# Patient Record
Sex: Male | Born: 1985 | Hispanic: Yes | Marital: Married | Smoking: Never smoker
Health system: Southern US, Community
[De-identification: ages and names within clinical notes are randomized; demographics above are authoritative.]

## PROBLEM LIST (undated history)

## (undated) DIAGNOSIS — N189 Chronic kidney disease, unspecified: Secondary | ICD-10-CM

## (undated) HISTORY — DX: Chronic kidney disease, unspecified: N18.9

---

## 2015-02-14 ENCOUNTER — Ambulatory Visit (INDEPENDENT_AMBULATORY_CARE_PROVIDER_SITE_OTHER): Payer: Self-pay | Admitting: Family Medicine

## 2015-02-14 VITALS — BP 120/78 | HR 73 | Temp 99.1°F | Resp 20 | Ht 73.0 in | Wt 275.5 lb

## 2015-02-14 DIAGNOSIS — R0789 Other chest pain: Secondary | ICD-10-CM

## 2015-02-14 DIAGNOSIS — R1013 Epigastric pain: Secondary | ICD-10-CM

## 2015-02-14 DIAGNOSIS — M949 Disorder of cartilage, unspecified: Secondary | ICD-10-CM

## 2015-02-14 MED ORDER — OMEPRAZOLE 20 MG PO CPDR
20.0000 mg | DELAYED_RELEASE_CAPSULE | Freq: Every day | ORAL | Status: DC
Start: 2015-02-14 — End: 2016-07-13

## 2015-02-14 MED ORDER — MELOXICAM 7.5 MG PO TABS: 7.5000 mg | ORAL_TABLET | Freq: Every day | ORAL | Status: DC

## 2015-02-14 NOTE — Patient Instructions (Signed)
Reflujo gastroesofgico - Adultos  (Gastroesophageal Reflux Disease, Adult)  El reflujo gastroesofgico ocurre cuando el cido del estmago pasa al esfago. Cuando el cido entra en contacto con el esfago, el cido provoca dolor (inflamacin) en el esfago. Con el tiempo, pueden formarse pequeos agujeros (lceras) en el revestimiento del esfago. CAUSAS   Exceso de Engineer, site. Esto aplica presin [employer], lo que hace que el cido del estmago suba hacia el esfago.  El hbito de fumar [place]a la produccin de cido en el [place].  El consumo de alcohol. Provoca disminucin de la presin en el esfnter esofgico inferior (vlvula o anillo de msculo entre el esfago y Product manager), permitiendo que el cido del estmago suba hacia el esfago.  Cenas a ltima hora del da y estmago lleno. Aumenta la presin y la produccin de cido en el estmago.  Malformacin en el esfnter esofgico inferior. A menudo no se halla causa.  SNTOMAS   Ardor y Aeronautical engineer parte inferior del pecho detrs del esternn y en la zona media del [place]. Puede ocurrir [employer] por semana o ms a menudo.  Dificultad para tragar.  Dolor de Investment banker, operational.  Tos seca.  Sntomas similares al asma que incluyen sensacin de opresin en el pecho, falta de aire y sibilancias. DIAGNSTICO  El mdico diagnosticar el problema basndose en los sntomas. En algunos casos, se indican radiografas y otras pruebas para verificar si hay complicaciones o para comprobar el estado del estmago y Education administrator.  TRATAMIENTO  El mdico le indicar medicamentos de venta libre o recetados para ayudar a disminuir la produccin de cido. Consulte con su mdico antes de Art gallery manager o agregar cualquier medicamento nuevo.  INSTRUCCIONES PARA EL CUIDADO EN EL HOGAR   Modifique los factores que pueda cambiar. Consulte con su mdico para solicitar orientacin relacionada con la prdida de peso, dejar de fumar y el consumo de  alcohol.  Evite las comidas y bebidas que empeoran los [place], [place]:  [place] con cafena o alcohlicas.  Chocolate.  Sabores a English as a second language teacher.  Ajo y cebolla.  Comidas muy condimentadas.  Ctricos como naranjas, limones o limas.  Alimentos que contengan tomate, como salsas, [place] y pizza.  Alimentos fritos y Radio broadcast assistant.  Evite acostarse durante 3 horas antes de irse a dormir o antes de tomar una siesta.  Haga comidas pequeas durante Psychiatrist de 3 comidas abundantes.  Use ropas sueltas. No use nada apretado alrededor de la cintura que cause presin en el estmago.  Levante (eleve) la cabecera de la cama 6 a 8 pulgadas (15 a 20 cm) con bloques de madera. Usar almohadas extra no ayuda.  Solo tome medicamentos que se pueden comprar sin receta o recetados para el dolor, Tree surgeon o fiebre, como le indica el mdico.  No tome aspirina, ibuprofeno ni antiinflamatorios no esteroides. [place] [employer] SI:   [employer], el cuello, la [place], los dientes o la espalda.  El dolor aumenta o cambia la intensidad o la durancin.  Tiene nuseas, vmitos o sudoracin(diaforesis).  Siente falta de aire o dolor en el pecho, o se desmaya.  Vomita y el vmito tiene [place], es de color [place], [place], negro o es similar a la borra del caf o tiene [place].  Las heces son rojas, sanguinolentas o negras. Estos sntomas pueden ser signos de otros problemas, como enfermedades cardacas, hemorragias gstrias o sangrado esofgico.  ASEGRESE DE QUE:   Comprende estas instrucciones.  Controlar su enfermedad.  Solicitar ayuda de inmediato si no mejora o si empeora. Document Released: 07/25/2005 Document Revised: 01/07/2012 [facility] Patient Information 2015 [place]Exit[place], [place]. This information is not intended to replace advice given to you by your health care provider. Make sure you discuss any questions you have with your health care provider. Dolor de la  pared torcica (Chest Wall Pain) Dolor en la pared torcica es dolor en o alrededor de los huesos y msculos de su pecho. Podrn pasar hasta 6 semanas hasta que comience a mejorar. Puede demorar ms tiempo si es fsicamente activo en su Aleen Campitrabajo y Gettysburgactividades.  CAUSAS  El dolor en el pecho puede aparecer sin motivo. No obstante, algunas causas pueden ser:   Neomia DearUna enfermedad viral como la gripe.  Traumatismos.  Tos.  La prctica de ejercicios.  Artritis.  Fibromialgia  [place]. INSTRUCCIONES PARA EL CUIDADO DOMICILIARIO  Evite hacer actividad fsica extenuante. Trate de no esforzarse o Electrical engineerrealizar actividades que le causen dolor. Aqu se incluyen las actividades en las que Bots[place]ausa los msculos del trax, los abdominales y los msculos laterales, especialmente si debe levantar objetos pesados.  Aplique hielo sobre la zona dolorida.  Ponga el hielo en una bolsa plstica.  Colquese una toalla entre la piel y la bolsa de hielo.  Deje la bolsa de hielo durante 15 a 20 minutos por hora, durante los primeros [address]  [address].  Utilice los medicamentos de venta libre o de prescripcin para Chief Technology Officerel dolor, Environmental health practitionerel malestar o la Spring Hillfiebre, segn se lo indique el profesional que lo asiste. SOLICITE ATENCIN MDICA DE INMEDIATO SI:  El dolor aumenta o siente muchas molestias.  Tiene fiebre.  El dolor de Oaklandpecho empeora.  Desarrolla nuevos e inexplicables sntomas.  Tiene nuseas o vmitos.  Berenice Primasranspira o se siente mareado.  Tiene tos con flema (esputo), o tose con sangre. EST SEGURO QUE:   Comprende las instrucciones para el alta mdica.  Controlar su enfermedad.  Solicitar atencin mdica de inmediato segn las indicaciones. Document Released: 11/26/2006 Document Revised: 01/07/2012 [facility] Patient Information 2015 [place]ExitCare, [place]LLC. This information is not intended to replace advice given to you by your health care provider. Make sure you discuss any questions you have with your health care  provider. Gastroesophageal Reflux Disease, Adult Gastroesophageal reflux disease (GERD) happens when acid from your stomach flows up into the esophagus. When acid comes in contact with the esophagus, the acid causes soreness (inflammation) in the esophagus. Over time, GERD may create small holes (ulcers) in the lining of the esophagus. CAUSES   Increased body weight. This puts pressure on the stomach, making acid rise from the stomach into the esophagus.  Smoking. This increases acid production in the stomach.  Drinking alcohol. This causes decreased pressure in the lower esophageal sphincter (valve or ring of muscle between the esophagus and stomach), allowing acid from the stomach into the esophagus.  Late evening meals and a full stomach. This increases pressure and acid production in the stomach.  A malformed lower esophageal sphincter. Sometimes, no cause is found. SYMPTOMS   Burning pain in the lower part of the mid-chest behind the breastbone and in the mid-stomach area. This may occur twice a week or more often.  Trouble swallowing.  Sore throat.  Dry cough.  Asthma-like symptoms including chest tightness, shortness of breath, or wheezing. DIAGNOSIS  Your caregiver may be able to diagnose GERD based on your symptoms. In some cases, X-rays and other tests may be done to check for complications or to check the condition of your stomach  and esophagus. TREATMENT  Your caregiver may recommend over-the-counter or prescription medicines to help decrease acid production. Ask your caregiver before starting or adding any new medicines.  HOME CARE INSTRUCTIONS   Change the factors that you can control. Ask your caregiver for guidance concerning weight loss, quitting smoking, and alcohol consumption.  Avoid foods and drinks that make your symptoms worse, such as:  Caffeine or alcoholic drinks.  Chocolate.  Peppermint or mint flavorings.  Garlic and onions.  Spicy  foods.  Citrus fruits, such as oranges, lemons, or limes.  Tomato-based foods such as sauce, chili, salsa, and pizza.  Fried and fatty foods.  Avoid lying down for the 3 hours prior to your bedtime or prior to taking a nap.  Eat small, frequent meals instead of large meals.  Wear loose-fitting clothing. Do not wear anything tight around your waist that causes pressure on your stomach.  Raise the head of your bed 6 to 8 inches with wood blocks to help you sleep. Extra pillows will not help.  Only take over-the-counter or prescription medicines for pain, discomfort, or fever as directed by your caregiver.  Do not take aspirin, ibuprofen, or other nonsteroidal anti-inflammatory drugs (NSAIDs). SEEK IMMEDIATE MEDICAL CARE IF:   You have pain in your arms, neck, jaw, teeth, or back.  Your pain increases or changes in intensity or duration.  You develop nausea, vomiting, or sweating (diaphoresis).  You develop shortness of breath, or you faint.  Your vomit is green, yellow, black, or looks like coffee grounds or blood.  Your stool is red, bloody, or black. These symptoms could be signs of other problems, such as heart disease, gastric bleeding, or esophageal bleeding. MAKE SURE YOU:   Understand these instructions.  Will watch your condition.  Will get help right away if you are not doing well or get worse. Document Released: 07/25/2005 Document Revised: 01/07/2012 Document Reviewed: 05/04/2011 [facility] Patient Information 2015 [place], [place]. This information is not intended to replace advice given to you by your health care provider. Make sure you discuss any questions you have with your health care provider.

## 2015-02-14 NOTE — Progress Notes (Signed)
° °  Subjective:  This chart was scribed for Elvina SidleKurt Lauenstein MD,  by Veverly FellsHatice Demirci,scribe, at [facility].  This patient was seen in room 8 and the patient's care was started at 7:27 PM.    Chief Complaint  Patient presents with   Abdominal Pain     Patient ID: Paul Bates, male    DOB: [birth year], 29 y.o.   MRN: [id number]  HPI  HPI Comments: Paul Bates is a 29 y.o. male who [facility] for epigastric pain which is worsening when lifting his arms onset a couple days ago.  Patient is a Education administratorpainter.     Patient primarily speaks spanish.      Review of Systems  Constitutional: Negative for fever and chills.  HENT: Negative for ear discharge, ear pain, facial swelling, hearing loss, nosebleeds, postnasal drip and rhinorrhea.   Respiratory: Negative for cough, choking, chest tightness and shortness of breath.   Gastrointestinal: Positive for abdominal pain.       Objective:   Physical Exam BP 120/78 mmHg   Pulse 73   Temp(Src) 99.1 F (37.3 C) (Oral)   Resp 20   Ht 6\' 1"  (1.854 m)   Wt 275 lb 8 oz (124.966 kg)   BMI 36.36 kg/m2   SpO2 97%  Obese young adult male in no acute distress Tenderness of xyphoid as well as in his epigastrium with deep palpation. No HSM or masses Chest is clear Heart: Regular no murmur Abdomen: No rash      Assessment & Plan:   This chart was scribed in my presence and reviewed by me personally.    ICD-9-CM ICD-10-CM   1. Xiphoid pain 733.90 M94.9 meloxicam (MOBIC) 7.5 MG tablet  2. Abdominal pain, epigastric 789.06 R10.13 omeprazole (PRILOSEC) 20 MG capsule   No acute findings. I think patient is more concerned the discomfort in his xiphoid which appears to be a musculoskeletal strain.  Signed, Elvina SidleKurt Lauenstein, MD

## 2016-07-13 ENCOUNTER — Emergency Department (HOSPITAL_COMMUNITY)
Admission: EM | Admit: 2016-07-13 | Discharge: 2016-07-13 | Disposition: A | Discharge status: Home / Self Care | Payer: Self-pay | Attending: Emergency Medicine | Admitting: Emergency Medicine

## 2016-07-13 ENCOUNTER — Encounter (HOSPITAL_COMMUNITY): Payer: Self-pay | Admitting: *Deleted

## 2016-07-13 ENCOUNTER — Emergency Department (HOSPITAL_COMMUNITY): Payer: Self-pay

## 2016-07-13 DIAGNOSIS — N201 Calculus of ureter: Secondary | ICD-10-CM

## 2016-07-13 DIAGNOSIS — N132 Hydronephrosis with renal and ureteral calculous obstruction: Secondary | ICD-10-CM | POA: Insufficient documentation

## 2016-07-13 LAB — CBC WITH DIFFERENTIAL/PLATELET
BASOS PCT: 0 %
Basophils Absolute: 0 10*3/uL (ref 0.0–0.1)
EOS ABS: 0 10*3/uL (ref 0.0–0.7)
Eosinophils Relative: 0 %
HCT: 42.7 % (ref 39.0–52.0)
Hemoglobin: 14.2 g/dL (ref 13.0–17.0)
LYMPHS ABS: 0.9 10*3/uL (ref 0.7–4.0)
Lymphocytes Relative: 8 %
MCH: 27.4 pg (ref 26.0–34.0)
MCHC: 33.3 g/dL (ref 30.0–36.0)
MCV: 82.3 fL (ref 78.0–100.0)
MONO ABS: 0.5 10*3/uL (ref 0.1–1.0)
MONOS PCT: 5 %
NEUTROS PCT: 87 %
Neutro Abs: 10.2 10*3/uL — ABNORMAL HIGH (ref 1.7–7.7)
PLATELETS: 224 10*3/uL (ref 150–400)
RBC: 5.19 MIL/uL (ref 4.22–5.81)
RDW: 12.7 % (ref 11.5–15.5)
WBC: 11.7 10*3/uL — ABNORMAL HIGH (ref 4.0–10.5)

## 2016-07-13 LAB — BASIC METABOLIC PANEL
Anion gap: 9 (ref 5–15)
BUN: 17 mg/dL (ref 6–20)
CALCIUM: 9.4 mg/dL (ref 8.9–10.3)
CO2: 20 mmol/L — AB (ref 22–32)
CREATININE: 1.18 mg/dL (ref 0.61–1.24)
Chloride: 106 mmol/L (ref 101–111)
GFR calc non Af Amer: >60 mL/min (ref >60)
GLUCOSE: 159 mg/dL — AB (ref 65–99)
Potassium: 4.2 mmol/L (ref 3.5–5.1)
Sodium: 135 mmol/L (ref 135–145)

## 2016-07-13 LAB — URINALYSIS, ROUTINE W REFLEX MICROSCOPIC
BILIRUBIN URINE: NEGATIVE
Glucose, UA: 100 mg/dL — AB
KETONES UR: NEGATIVE mg/dL
Leukocytes, UA: NEGATIVE
NITRITE: NEGATIVE
Protein, ur: NEGATIVE mg/dL
Specific Gravity, Urine: 1.025 (ref 1.005–1.030)
pH: 5 (ref 5.0–8.0)

## 2016-07-13 LAB — URINE MICROSCOPIC-ADD ON

## 2016-07-13 MED ORDER — TAMSULOSIN HCL 0.4 MG PO CAPS: 0.4000 mg | ORAL_CAPSULE | Freq: Every day | ORAL | 0 refills | Status: DC

## 2016-07-13 MED ORDER — KETOROLAC TROMETHAMINE 30 MG/ML IJ SOLN
30.0000 mg | Freq: Once | INTRAMUSCULAR | Status: AC
  Administered 2016-07-13: 30 mg via INTRAVENOUS
  Filled 2016-07-13: qty 1

## 2016-07-13 MED ORDER — MORPHINE SULFATE (PF) 4 MG/ML IV SOLN
4.0000 mg | Freq: Once | INTRAVENOUS | Status: AC
  Administered 2016-07-13: 4 mg via INTRAVENOUS
  Filled 2016-07-13: qty 1

## 2016-07-13 MED ORDER — SODIUM CHLORIDE 0.9 % IV SOLN
1000.0000 mL | INTRAVENOUS | Status: DC
  Administered 2016-07-13: 1000 mL via INTRAVENOUS

## 2016-07-13 MED ORDER — SODIUM CHLORIDE 0.9 % IV SOLN
1000.0000 mL | Freq: Once | INTRAVENOUS | Status: AC
  Administered 2016-07-13: 1000 mL via INTRAVENOUS

## 2016-07-13 MED ORDER — OXYCODONE-ACETAMINOPHEN 5-325 MG PO TABS: 1.0000 | ORAL_TABLET | ORAL | 0 refills | Status: DC | PRN

## 2016-07-13 MED ORDER — ONDANSETRON HCL 4 MG/2ML IJ SOLN
4.0000 mg | Freq: Once | INTRAMUSCULAR | Status: AC
  Administered 2016-07-13: 4 mg via INTRAVENOUS
  Filled 2016-07-13: qty 2

## 2016-07-13 NOTE — ED Provider Notes (Signed)
[facility] Provider Note   CSN: [id number] Arrival date & time: 07/13/16  4098     History   Chief Complaint Chief Complaint  Patient presents with  . Constipation  . Back Pain    HPI Paul Bates is a 30 y.o. male.  The history is provided by the patient.  He had onset about 3 hours ago of severe pain in the left flank radiating around to the suprapubic area. Nothing makes pain better nothing makes it worse. He rates pain at 9/10. There is no associated nausea or vomiting. He has a sense of having to defecate but is unable to. He has not done anything to treat the pain. He denies similar pains in the past.  History reviewed. No pertinent past medical history.  There are no active problems to display for this patient.   History reviewed. No pertinent surgical history.     Home Medications    Prior to Admission medications   Medication Sig Start Date End Date Taking? Authorizing Provider  meloxicam (MOBIC) 7.5 MG tablet Take 1 tablet (7.5 mg total) by mouth daily. 02/14/15   Elvina Sidle, MD  omeprazole (PRILOSEC) 20 MG capsule Take 1 capsule (20 mg total) by mouth daily. 02/14/15   Elvina Sidle, MD    Family History No family history on file.  Social History Social History  Substance Use Topics  . Smoking status: Never Smoker  . Smokeless tobacco: Never Used  . Alcohol use No     Allergies   Review of patient's allergies indicates no known allergies.   Review of Systems Review of Systems  All other systems reviewed and are negative.    Physical Exam Updated Vital Signs BP 142/98 (BP Location: Right Arm)   Pulse 95   Temp 98.4 F (36.9 C) (Oral)   Resp 22   Wt 273 lb 6.4 oz (124 kg)   SpO2 97%   BMI 36.07 kg/m   Physical Exam  Nursing note and vitals reviewed.  30 year old male, appears uncomfortable, but is in no acute distress. Vital signs are significant for borderline tachypnea and mild hypertension. Oxygen  saturation is 97%, which is normal. Head is normocephalic and atraumatic. PERRLA, EOMI. Oropharynx is clear. Neck is nontender and supple without adenopathy or JVD. Back is nontender in the midline. There is moderate left CVA tenderness. Lungs are clear without rales, wheezes, or rhonchi. Chest is nontender. Heart has regular rate and rhythm without murmur. Abdomen is soft, flat, nontender without masses or hepatosplenomegaly and peristalsis is hypoactive. Extremities have no cyanosis or edema, full range of motion is present. Skin is warm and dry without rash. Neurologic: Mental status is normal, cranial nerves are intact, there are no motor or sensory deficits.  ED Treatments / Results  Labs (all labs ordered are listed, but only abnormal results are displayed) Labs Reviewed  BASIC METABOLIC PANEL - Abnormal; Notable for the following:       Result Value   CO2 20 (*)    Glucose, Bld 159 (*)    All other components within normal limits  CBC WITH DIFFERENTIAL/PLATELET - Abnormal; Notable for the following:    WBC 11.7 (*)    Neutro Abs 10.2 (*)    All other components within normal limits  URINALYSIS, ROUTINE W REFLEX MICROSCOPIC (NOT AT [facility])    Radiology Ct Renal Stone Study  Result Date: 07/13/2016 CLINICAL DATA:  Left flank pain EXAM: CT ABDOMEN AND PELVIS WITHOUT CONTRAST TECHNIQUE: Multidetector  CT imaging of the abdomen and pelvis was performed following the standard protocol without IV contrast. COMPARISON:  None. FINDINGS: Lower chest: Minor atelectatic appearing linear opacities in the posterior lung bases. Hepatobiliary: Fatty liver without suspicious focal lesion. Gallbladder and bile ducts are unremarkable. Pancreas: Normal Spleen: Norm Adrenals/Urinary Tract: There is an obstructing 3 mm calculus at the left ureterovesical junction with moderate ureteral dilatation and hydronephrosis. There are at least 2 additional lower pole left collecting system calculi measuring 2-3  mm. No suspicious renal parenchymal lesions. Both adrenals are normal. The urinary bladder is unremarkable. Stomach/Bowel: There are normal appearances of the stomach, small bowel and colon. The appendix is normal. Vascular/Lymphatic: The abdominal aorta is normal in caliber. There is no atherosclerotic calcification. There is no adenopathy in the abdomen or pelvis. Reproductive: Unremarkable Other: Musculoskeletal: No significant skeletal abnormality. IMPRESSION: Obstructing 3 mm left UVJ calculus with moderate hydronephrosis. Left nephrolithiasis. Fatty liver. Electronically Signed   By: Ellery Plunkaniel R Mitchell M.D.   On: 07/13/2016 05:39    Procedures Procedures (including critical care time)  Medications Ordered in ED Medications  0.9 %  sodium chloride infusion (0 mLs Intravenous Stopped 07/13/16 0609)    Followed by  0.9 %  sodium chloride infusion (0 mLs Intravenous Stopped 07/13/16 0622)  ketorolac (TORADOL) 30 MG/ML injection 30 mg (30 mg Intravenous Given 07/13/16 0455)  ondansetron (ZOFRAN) injection 4 mg (4 mg Intravenous Given 07/13/16 0455)  morphine 4 MG/ML injection 4 mg (4 mg Intravenous Given 07/13/16 0455)     Initial Impression / Assessment and Plan / ED Course  I have reviewed the triage vital signs and the nursing notes.  Pertinent labs & imaging results that were available during my care of the patient were reviewed by me and considered in my medical decision making (see chart for details).  Clinical Course   Left flank pain worrisome for ureterolithiasis. He'll be given morphine and ketorolac and will be sent for renal stone protocol CT scan. Old records are reviewed, and he has no relevant past visits.  CT shows distal left ureteral calculus. This is likely to pass without any interventions. He got good relief of pain with above noted treatment. He is discharged with prescriptions for tamsulosin and oxycodone have acetaminophen, told to use over-the-counter naproxen or  ibuprofen.  Final Clinical Impressions(s) / ED Diagnoses   Final diagnoses:  Ureterolithiasis    New Prescriptions New Prescriptions   OXYCODONE-ACETAMINOPHEN (PERCOCET) 5-325 MG TABLET    Take 1 tablet by mouth every 4 (four) hours as needed for moderate pain.   TAMSULOSIN (FLOMAX) 0.4 MG CAPS CAPSULE    Take 1 capsule (0.4 mg total) by mouth daily.     Dione Boozeavid Devika Dragovich, MD 07/13/16 [phone]

## 2016-07-13 NOTE — ED Notes (Signed)
Pt aware of need for urine  

## 2016-07-13 NOTE — ED Triage Notes (Signed)
Patient states he is having a hard time having a BM (3 hours) and c/o back pain

## 2016-07-13 NOTE — ED Triage Notes (Signed)
Patient stating the pain travels around the flank area to the left front and having difficulty making water

## 2016-07-13 NOTE — ED Notes (Signed)
Patient transported to CT 

## 2016-07-13 NOTE — Discharge Instructions (Signed)
Take ibuprofen or naproxen. Return if pain is not being controlled, or if you start running a fever.

## 2016-11-08 ENCOUNTER — Ambulatory Visit (INDEPENDENT_AMBULATORY_CARE_PROVIDER_SITE_OTHER): Payer: Self-pay

## 2016-11-08 ENCOUNTER — Ambulatory Visit (INDEPENDENT_AMBULATORY_CARE_PROVIDER_SITE_OTHER): Payer: Self-pay | Admitting: Urgent Care

## 2016-11-08 VITALS — BP 98/70 | HR 81 | Temp 98.5°F | Resp 17 | Ht 73.0 in | Wt 297.0 lb

## 2016-11-08 DIAGNOSIS — R109 Unspecified abdominal pain: Secondary | ICD-10-CM

## 2016-11-08 DIAGNOSIS — N200 Calculus of kidney: Secondary | ICD-10-CM

## 2016-11-08 DIAGNOSIS — R319 Hematuria, unspecified: Secondary | ICD-10-CM

## 2016-11-08 LAB — POCT URINALYSIS DIP (MANUAL ENTRY)
BILIRUBIN UA: NEGATIVE
BILIRUBIN UA: NEGATIVE
Glucose, UA: NEGATIVE
LEUKOCYTES UA: NEGATIVE
NITRITE UA: NEGATIVE
PH UA: 7
PROTEIN UA: NEGATIVE
RBC UA: NEGATIVE
Spec Grav, UA: 1.02
Urobilinogen, UA: 0.2

## 2016-11-08 LAB — POCT CBC
Granulocyte percent: 79.3 %G (ref 37–80)
HEMATOCRIT: 44.3 % (ref 43.5–53.7)
Hemoglobin: 15.4 g/dL (ref 14.1–18.1)
LYMPH, POC: 1.6 (ref 0.6–3.4)
MCH, POC: 27.7 pg (ref 27–31.2)
MCHC: 34.7 g/dL (ref 31.8–35.4)
MCV: 79.9 fL — AB (ref 80–97)
MID (CBC): 0.6 (ref 0–0.9)
MPV: 8.1 fL (ref 0–99.8)
PLATELET COUNT, POC: 204 10*3/uL (ref 142–424)
POC Granulocyte: 8.6 — AB (ref 2–6.9)
POC LYMPH %: 14.7 % (ref 10–50)
POC MID %: 6 %M (ref 0–12)
RBC: 5.54 M/uL (ref 4.69–6.13)
RDW, POC: 12.9 %
WBC: 10.8 10*3/uL — AB (ref 4.6–10.2)

## 2016-11-08 MED ORDER — OXYCODONE-ACETAMINOPHEN 5-325 MG PO TABS: 1.0000 | ORAL_TABLET | Freq: Three times a day (TID) | ORAL | 0 refills | Status: DC | PRN

## 2016-11-08 MED ORDER — TAMSULOSIN HCL 0.4 MG PO CAPS: 0.4000 mg | ORAL_CAPSULE | Freq: Every day | ORAL | 1 refills | Status: DC

## 2016-11-08 NOTE — Patient Instructions (Addendum)
Clico renal (Renal Colic) El clico renal es un dolor causado por el paso de un clculo en el rin. El dolor puede ser agudo e intenso. Puede sentirse en la espalda, el abdomen, al costado (fosa lumbar) o la ingle. Puede causar nuseas. El clico renal puede aparecer y Geneticist, moleculardesaparecer. INSTRUCCIONES PARA EL CUIDADO EN EL HOGAR Controle su afeccin para ver si hay cambios. Las siguientes medidas pueden servir para Paramedicaliviar cualquier molestia que est sintiendo:  Tome los medicamentos solamente como se lo haya indicado el mdico.  Pregntele al mdico si puede tomar analgsicos de Whitefaceventa libre.  Beba suficiente lquido para Photographermantener la orina clara o de color amarillo plido. Albesa SeenBeba entre 6 y 8vasos de agua por Futures traderda.  Limite la cantidad de sal que consume a menos de 2gramos por da.  Reduzca la cantidad de protenas de la dieta. Consuma menos carne, pescado, frutos secos y productos lcteos.  Evite alimentos como la espinaca, el ruibarbo, los frutos secos o el salvado, ya que pueden aumentar la probabilidad de que se formen clculos. SOLICITE ATENCIN MDICA SI:  Tiene fiebre o siente escalofros.  La orina se torna de color turbio o tiene American Standard Companiesolor fuerte.  Siente dolor o ardor al Geographical information systems officerorinar. SOLICITE ATENCIN MDICA DE INMEDIATO SI:  El dolor en la fosa lumbar o la ingle se intensifica repentinamente.  Est confundido o desorientado, o pierde la conciencia. Esta informacin no tiene Theme park managercomo fin reemplazar el consejo del mdico. Asegrese de hacerle al mdico cualquier pregunta que tenga. Document Released: 07/25/2005 Document Revised: 11/05/2014 Document Reviewed: 08/25/2014 Elsevier Interactive Patient Education  2017 [employer].     IF you received an x-ray today, you will receive an invoice from [facility]. Please contact [facility] at [phone] with questions or concerns regarding your invoice.   IF you received labwork today, you will receive an invoice from  [facility]. Please contact [facility] at [phone] with questions or concerns regarding your invoice.   Our billing staff will not be able to assist you with questions regarding bills from these companies.  You will be contacted with the lab results as soon as they are available. The fastest way to get your results is to activate your My Chart account. Instructions are located on the last page of this paperwork. If you have not heard from us regarding the results in 2 weeks, please contact this office.

## 2016-11-08 NOTE — Progress Notes (Signed)
MRN: [id number] DOB: [birth year]  Subjective:   Paul Bates is a 31 y.o. male presenting for chief complaint of Flank Pain (Kidney stone. Was in ED 3 months ago for same issue. Patient is in pain) and Hematuria  Reports 2 day history of left flank pain that radiates anteriorly to left abdomen and testicle. Pain is constant, sharp. Also has had hematuria, urinary frequency. Patient was seen in ED ~3 months ago for the same issue, treated with hydrocodone and Flomax (was given 5 pills only). He has not tried those medications for this episodes. Has tried to hydrate more. Admits that he did not strain urine but never saw a stone pass either. Of note, patient admits that he is improved some today, did not see blood in his urine and is not peeing as frequently. In fact, states that he is peeing normally but only wants to be checked. Denies fever, n/v, dysuria, bloody stools, constipation.   Kaushal has a current medication list which includes the following prescription(s): oxycodone-acetaminophen and tamsulosin. Also has No Known Allergies.  [place]  has a past medical history of Chronic kidney disease. Also  has no past surgical history on file.  Objective:   Vitals: BP 98/70 (BP Location: Right Arm, Patient Position: Sitting, Cuff Size: Large)   Pulse 81   Temp 98.5 F (36.9 C) (Oral)   Resp 17   Ht 6\' 1"  (1.854 m)   Wt 297 lb (134.7 kg)   SpO2 98%   BMI 39.18 kg/m   BP Readings from Last 3 Encounters:  11/08/16 98/70  07/13/16 140/80  02/14/15 120/78   Physical Exam  Constitutional: He is oriented to person, place, and time. He appears well-developed and well-nourished.  HENT:  Mouth/Throat: Oropharynx is clear and moist.  Cardiovascular: Normal rate, regular rhythm and intact distal pulses.  Exam reveals no gallop and no friction rub.   No murmur heard. Pulmonary/Chest: No respiratory distress. He has no wheezes. He has no rales.  Abdominal: Soft. Bowel sounds are  normal. He exhibits no distension and no mass. There is tenderness. There is no rebound and no guarding.  Mild left-sided CVA tenderness.  Neurological: He is alert and oriented to person, place, and time.  Skin: Skin is warm and dry.   Dg Abd 1 View  Result Date: 11/08/2016 CLINICAL DATA:  Right lower quadrant abdominal pain and left-sided low back pain associated with hematuria. History of kidney stones. EXAM: ABDOMEN - 1 VIEW COMPARISON:  Abdominal and pelvic CT scan dated July 13, 2016 FINDINGS: No abnormal calcifications project over either kidney. Along the course of the ureters no discrete stones are observed. There is a phlebolith in the right aspect of the pelvis which is stable. The bowel gas pattern is normal. IMPRESSION: No calcified urinary tract stone is observed. Electronically Signed   By: David  [place] M.D.   On: 11/08/2016 14:26    Results for orders placed or performed in visit on 11/08/16 (from the past 24 hour(s))  POCT urinalysis dipstick     Status: None   Collection Time: 11/08/16  2:44 PM  Result Value Ref Range   Color, UA yellow yellow   Clarity, UA clear clear   Glucose, UA negative negative   Bilirubin, UA negative negative   Ketones, POC UA negative negative   Spec Grav, UA 1.020    Blood, UA negative negative   pH, UA 7.0    Protein Ur, POC negative negative   Urobilinogen, UA 0.2  Nitrite, UA Negative Negative   Leukocytes, UA Negative Negative  POCT CBC     Status: Abnormal   Collection Time: 11/08/16  3:03 PM  Result Value Ref Range   WBC 10.8 (A) 4.6 - 10.2 K/uL   Lymph, poc 1.6 0.6 - 3.4   POC LYMPH PERCENT 14.7 10 - 50 %L   MID (cbc) 0.6 0 - 0.9   POC MID % 6.0 0 - 12 %M   POC Granulocyte 8.6 (A) 2 - 6.9   Granulocyte percent 79.3 37 - 80 %G   RBC 5.54 4.69 - 6.13 M/uL   Hemoglobin 15.4 14.1 - 18.1 g/dL   HCT, POC 60.444.3 54.043.5 - 53.7 %   MCV 79.9 (A) 80 - 97 fL   MCH, POC 27.7 27 - 31.2 pg   MCHC 34.7 31.8 - 35.4 g/dL   RDW, POC 98.112.9 %    Platelet Count, POC 204 142 - 424 K/uL   MPV 8.1 0 - 99.8 fL   Assessment and Plan :   1. Renal stone 2. Hematuria, unspecified type 3. Left flank pain 4. Left sided abdominal pain - Will manage as renal stone. Overall, patient is clinically stable, labs pending. Start Flomax, use oxycodone as needed for pain. Hydrate aggressively. Strain urine. Counseled on warning signs warranting recheck. Patient verbalized understanding.  Wallis BambergMario Eligio Angert, PA-C Primary Care at [facility] [phone] 11/08/2016  2:01 PM

## 2016-11-09 LAB — BASIC METABOLIC PANEL
BUN/Creatinine Ratio: 17 (ref 9–20)
BUN: 14 mg/dL (ref 6–20)
CHLORIDE: 97 mmol/L (ref 96–106)
CO2: 24 mmol/L (ref 18–29)
Calcium: 9.3 mg/dL (ref 8.7–10.2)
Creatinine, Ser: 0.81 mg/dL (ref 0.76–1.27)
GFR calc Af Amer: 140 mL/min/{1.73_m2} (ref >59)
GFR calc non Af Amer: 121 mL/min/{1.73_m2} (ref >59)
Glucose: 84 mg/dL (ref 65–99)
POTASSIUM: 4.4 mmol/L (ref 3.5–5.2)
Sodium: 139 mmol/L (ref 134–144)

## 2016-11-09 LAB — URINALYSIS, MICROSCOPIC ONLY
BACTERIA UA: NONE SEEN
Casts: NONE SEEN /lpf
Epithelial Cells (non renal): NONE SEEN /hpf (ref 0–10)
RBC, UA: >30 /hpf — AB (ref 0–?)

## 2016-11-10 LAB — URINE CULTURE: Organism ID, Bacteria: NO GROWTH

## 2017-08-20 ENCOUNTER — Ambulatory Visit: Payer: Self-pay | Admitting: Urgent Care

## 2017-08-23 ENCOUNTER — Encounter: Payer: Self-pay | Admitting: Urgent Care

## 2017-08-23 ENCOUNTER — Ambulatory Visit (INDEPENDENT_AMBULATORY_CARE_PROVIDER_SITE_OTHER): Payer: Self-pay | Admitting: Urgent Care

## 2017-08-23 VITALS — HR 88 | Resp 16 | Ht 73.0 in | Wt 306.0 lb

## 2017-08-23 DIAGNOSIS — N50811 Right testicular pain: Secondary | ICD-10-CM

## 2017-08-23 DIAGNOSIS — N200 Calculus of kidney: Secondary | ICD-10-CM

## 2017-08-23 DIAGNOSIS — Z87442 Personal history of urinary calculi: Secondary | ICD-10-CM

## 2017-08-23 LAB — POCT URINALYSIS DIP (MANUAL ENTRY)
BILIRUBIN UA: NEGATIVE
BILIRUBIN UA: NEGATIVE mg/dL
GLUCOSE UA: NEGATIVE mg/dL
Leukocytes, UA: NEGATIVE
NITRITE UA: NEGATIVE
Protein Ur, POC: NEGATIVE mg/dL
Urobilinogen, UA: 0.2 E.U./dL
pH, UA: 5.5 (ref 5.0–8.0)

## 2017-08-23 MED ORDER — TAMSULOSIN HCL 0.4 MG PO CAPS: 0.4000 mg | ORAL_CAPSULE | Freq: Every day | ORAL | 1 refills | Status: DC

## 2017-08-23 NOTE — Patient Instructions (Addendum)
[facility] Department Address: [address] [place], [place] [address]  Phone: [phone]    Deshidratacin en los adultos (Dehydration, Adult) La deshidratacin es un cuadro clnico que se produce cuando no se tiene la cantidad suficiente de lquido o de agua en el organismo. Se produce cuando se toma menos lquido del que se pierde. Los rganos [employer] riones, el cerebro y el [place], no pueden funcionar sin una cantidad [place] de agua y Airline pilot. Cualquier prdida de lquidos del organismo puede causar deshidratacin.  La deshidratacin puede ser leve o grave. Este cuadro clnico se debe tratar de inmediato para evitar que se agrave. CAUSAS  Este cuadro clnico puede deberse a lo siguiente:  Vmitos.  Diarrea.  Exceso de sudoracin, por ejemplo, al realizar actividad fsica cuando hace calor o hay humedad.  No beber la cantidad suficiente de lquido mientras realizan actividad fsica extenuante o cuando estn enfermos.  Excesiva eliminacin de [place].  Grant Ruts.  Algunos medicamentos. FACTORES DE RIESGO Es ms probable que este cuadro clnico se [employer] en:  Las personas que estn tomando determinados medicamentos que causan la prdida excesiva de lquido del organismo (diurticos).   Las personas que sufren una enfermedad crnica, como diabetes, que puede aumentar la miccin.  Adultos mayores.   Las personas que viven a Development worker, international aid.   Las personas que practican deportes de resistencia.  SNTOMAS  Deshidratacin leve  Sed.  Labios secos.  Sequedad leve en la boca.  Piel seca y caliente. Deshidratacin [employer].   Calambres musculares.   Larose Kells y disminucin de la produccin de [place].   Disminucin de la produccin de lgrimas.   Dolor de [place].   Sensacin de desvanecimiento, especialmente al ponerse de pie.  Deshidratacin grave  Cambios en la piel.  ? Piel fra y hmeda.  ? La piel  no vuelve rpidamente a su lugar cuando se la suelta luego de pellizcarla ligeramente.   Cambios en los lquidos corporales.  ? Sed extrema.  ? Falta de lgrimas.  ? Imposibilidad de transpirar cuando la temperatura corporal es alta, por ejemplo, cuando hace calor.  ? Mnima produccin de [place].   Cambios en las constantes vitales.  ? Pulso rpido y dbil (ms de 100pulsaciones por minuto cuando est quieto).  ? Respiracin rpida.  ? Presin arterial baja.   Otros cambios.  ? Ojos hundidos.  ? Manos y pies fros.  ? Confusin. ? Aletargamiento y dificultad para mantenerse despierto. ? Desmayos (sncope).  ? Prdida de peso a Product manager.  ? Prdida del conocimiento. DIAGNSTICO  Este cuadro clnico se puede diagnosticar en funcin de los sntomas. Tambin se pueden hacer estudios para determinar la gravedad de la deshidratacin. Estos estudios pueden [employer] siguientes:   Anlisis de [place].   Anlisis de [place].  TRATAMIENTO  El tratamiento de este cuadro clnico depende de la gravedad. La deshidratacin leve o moderada a menudo puede tratarse en la casa. El tratamiento se debe comenzar de inmediato. No espere hasta que la deshidratacin sea grave. La deshidratacin grave debe ser tratada en el hospital. Tratamiento para la deshidratacin leve  Beber abundante agua para reemplazar el lquido que se perdi.   Reemplazar los minerales en la sangre (electrolitos) que se pueden haber perdido.  Tratamiento para la deshidratacin moderada  Consumir una solucin de rehidratacin oral (SRO). Tratamiento para la deshidratacin grave  Recibir lquidos a travs de una va intravenosa (IV).   Recibir una solucin de electrolitos a  travs de una sonda de alimentacin que se coloca a travs de la nariz hasta el estmago (sonda nasogstrica o sonda NG).  Corregir las ONEOKanomalas en los electrolitos. INSTRUCCIONES PARA EL CUIDADO EN EL HOGAR   Beba suficiente  lquido para mantener la orina clara o de color amarillo plido.   Beba lentamente pequeos sorbos de agua o de lquido. Tambin puede chupar cubos de hielo.  Consuma alimentos o bebidas que contengan electrolitos. Como por ejemplo, bananas y bebidas deportivas.  Tome los medicamentos de venta libre y los recetados solamente como se lo haya indicado el mdico.   Prepare la solucin de rehidratacin oral de acuerdo con las indicaciones del fabricante. Tome sorbos de la solucin de rehidratacin oral cada 5minutos hasta que la orina se normalice.  Si tiene vmitos o diarrea, siga tratando de beber agua, una solucin de rehidratacin oral o ambos.   Si tiene diarrea, debe evitar lo siguiente:  ? Las bebidas que contengan cafena.  ? El jugo de frutas.  ? MotorolaLa leche.  ? Las American Electric Powerbebidas gaseosas.  No tome comprimidos de sal. Esto puede causar la acumulacin excesiva de sodio en el organismo (hipernatremia).  SOLICITE ATENCIN MDICA SI:  No puede comer ni beber sin vomitar.  Ha tenido diarrea moderada durante ms de 24horas.  Tiene fiebre. SOLICITE ATENCIN MDICA DE INMEDIATO SI:   Siente sed extrema.  Tiene una diarrea intensa.  No ha orinado durante 6 a 8horas o solo ha orinado una cantidad pequea de [place] muy oscura.  Tiene la piel arrugada.  Est mareado, confundido o tiene ambos sntomas. Esta informacin no tiene Theme park managercomo fin reemplazar el consejo del mdico. Asegrese de hacerle al mdico cualquier pregunta que tenga. Document Released: 10/15/2005 Document Revised: 07/06/2015 Elsevier Interactive Patient Education  2017 [employer]vier [employer].   IF you received an x-ray today, you will receive an invoice from [facility]. Please contact [facility] at [phone] with questions or concerns regarding your invoice.   IF you received labwork today, you will receive an invoice from [facility]. Please contact [facility] at [phone] with questions or concerns  regarding your invoice.   Our billing staff will not be able to assist you with questions regarding bills from these companies.  You will be contacted with the lab results as soon as they are available. The fastest way to get your results is to activate your My Chart account. Instructions are located on the last page of this paperwork. If you have not heard from us regarding the results in 2 weeks, please contact this office.

## 2017-08-23 NOTE — Progress Notes (Signed)
  MRN: [id number] DOB: [birth year]  Subjective:   Paul Bates is a 31 y.o. male presenting for chief complaint of Pain (patient would prefer to discuss with provider)  Reports 2 day history of right testicular pain (now resolved). Also had left sided flank pain, nausea without vomiting. Denies fever, vomiting, pelvic pain, testicular swelling, erythema, rashes, dysuria, hematuria. Has a history of renal stones. He is in a monogamous relationship with his wife, has sex with just her.  Larene PickettFabian is not currently taking any medications. Also has No Known Allergies.  Larene PickettFabian  has a past medical history of Chronic kidney disease. Denies past surgical history.  Objective:   Vitals: Pulse 88   Resp 16   Ht 6\' 1"  (1.854 m)   Wt (!) 306 lb (138.8 kg)   SpO2 97%   BMI 40.37 kg/m   Physical Exam  Constitutional: He is oriented to person, place, and time. He appears well-developed and well-nourished.  Cardiovascular: Normal rate.   Pulmonary/Chest: Effort normal.  Abdominal: Hernia confirmed negative in the right inguinal area and confirmed negative in the left inguinal area.  Genitourinary: Right testis shows no mass, no swelling and no tenderness. Right testis is descended. Cremasteric reflex is not absent on the right side. Left testis shows no mass, no swelling and no tenderness. Left testis is descended. Cremasteric reflex is not absent on the left side. Uncircumcised.  Lymphadenopathy: No inguinal adenopathy noted on the right or left side.  Neurological: He is alert and oriented to person, place, and time.   Results for orders placed or performed in visit on 08/23/17 (from the past 24 hour(s))  POCT urinalysis dipstick     Status: Abnormal   Collection Time: 08/23/17 10:02 AM  Result Value Ref Range   Color, UA yellow yellow   Clarity, UA clear clear   Glucose, UA negative negative mg/dL   Bilirubin, UA negative negative   Ketones, POC UA negative negative mg/dL   Spec Grav, UA  >=7.829>=1.030 (A) 1.010 - 1.025   Blood, UA trace-lysed (A) negative   pH, UA 5.5 5.0 - 8.0   Protein Ur, POC negative negative mg/dL   Urobilinogen, UA 0.2 0.2 or 1.0 E.U./dL   Nitrite, UA Negative Negative   Leukocytes, UA Negative Negative    Assessment and Plan :   1. Testicular pain, right 2. History of renal stone 3. Renal stone - Urine dipstick shows dehydration. Urine culture pending. Return-to-clinic precautions discussed, patient verbalized understanding.   Wallis BambergMario Shaneice Barsanti, PA-C [facility] Care at [facility] [phone] 08/23/2017  9:17 AM

## 2017-08-24 LAB — URINE CULTURE: ORGANISM ID, BACTERIA: NO GROWTH

## 2017-09-14 IMAGING — DX DG ABDOMEN 1V
1 series · 1 of 1 positions shown · non-contrast
Comparison: Abdominal and pelvic CT scan dated July 13, 2016

CLINICAL DATA: Right lower quadrant abdominal pain and left-sided
low back pain associated with hematuria. History of kidney stones.

EXAM:
ABDOMEN - 1 VIEW

[abdomen kub]
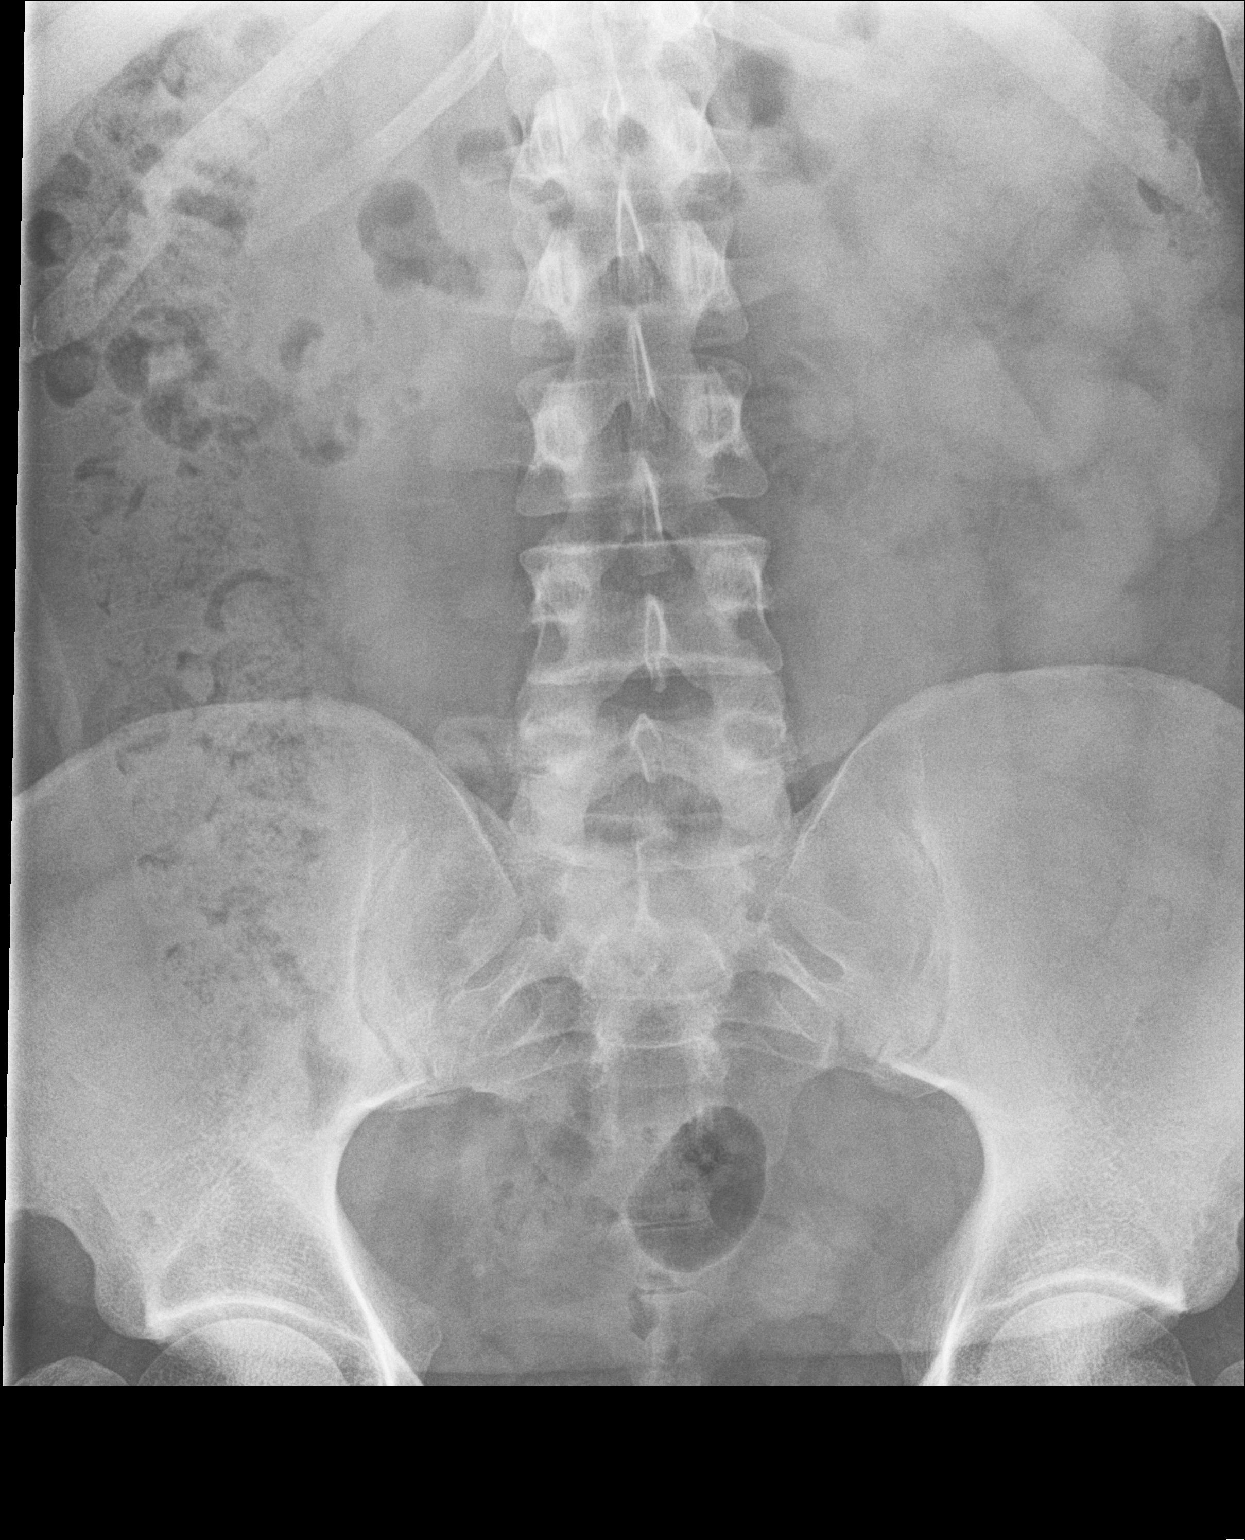

[1 of 1 positions shown; findings below may reference images not displayed]

FINDINGS: No abnormal calcifications project over either kidney. Along the
course of the ureters no discrete stones are observed. There is a
phlebolith in the right aspect of the pelvis which is stable.

The bowel gas pattern is normal.
IMPRESSION: No calcified urinary tract stone is observed.

## 2017-10-25 ENCOUNTER — Encounter: Payer: Self-pay | Admitting: Urgent Care

## 2017-10-25 ENCOUNTER — Other Ambulatory Visit: Payer: Self-pay

## 2017-10-25 ENCOUNTER — Ambulatory Visit (INDEPENDENT_AMBULATORY_CARE_PROVIDER_SITE_OTHER): Payer: Self-pay | Admitting: Urgent Care

## 2017-10-25 VITALS — HR 80 | Resp 16 | Ht 73.0 in | Wt 306.2 lb

## 2017-10-25 DIAGNOSIS — N50819 Testicular pain, unspecified: Secondary | ICD-10-CM

## 2017-10-25 DIAGNOSIS — N200 Calculus of kidney: Secondary | ICD-10-CM

## 2017-10-25 LAB — POCT URINALYSIS DIP (MANUAL ENTRY)
Bilirubin, UA: NEGATIVE
Glucose, UA: NEGATIVE mg/dL
Ketones, POC UA: NEGATIVE mg/dL
Leukocytes, UA: NEGATIVE
NITRITE UA: NEGATIVE
PROTEIN UA: NEGATIVE mg/dL
SPEC GRAV UA: 1.02 (ref 1.010–1.025)
UROBILINOGEN UA: 0.2 U/dL
pH, UA: 6.5 (ref 5.0–8.0)

## 2017-10-25 LAB — POC MICROSCOPIC URINALYSIS (UMFC): MUCUS RE: ABSENT

## 2017-10-25 MED ORDER — TAMSULOSIN HCL 0.4 MG PO CAPS: 0.4000 mg | ORAL_CAPSULE | Freq: Every day | ORAL | 1 refills | Status: DC

## 2017-10-25 NOTE — Patient Instructions (Addendum)
Tome 500mg  de Tylenol con ibuprofen 400-600mg  cada 6 horas con comida para dolor y inflamacion del testiculo.     IF you received an x-ray today, you will receive an invoice from [facility]. Please contact [facility] at [phone] with questions or concerns regarding your invoice.   IF you received labwork today, you will receive an invoice from [facility]. Please contact [facility] at [phone] with questions or concerns regarding your invoice.   Our billing staff will not be able to assist you with questions regarding bills from these companies.  You will be contacted with the lab results as soon as they are available. The fastest way to get your results is to activate your My Chart account. Instructions are located on the last page of this paperwork. If you have not heard from us regarding the results in 2 weeks, please contact this office.

## 2017-10-25 NOTE — Addendum Note (Signed)
Addended by: Wallis BambergMANI, Britni Driscoll on: 10/25/2017 03:01 PM   Modules accepted: Orders

## 2017-10-25 NOTE — Progress Notes (Signed)
   MRN: [id number] DOB: 1986-10-17  Subjective:   Paul Bates is a 31 y.o. male presenting for follow up on 3 week history of intermittent testicular pain. Pain is transient, occurs when he sits and scrotum is between legs, relieved with standing. Denies fever, redness, swelling, trauma, penile discharge, dysuria, hematuria, urinary frequency, n/v, abdominal pain, flank pain, genital rash. Does not hydrate adequately.  Paul Bates has a current medication list which includes the following prescription(s): tamsulosin. Also has No Known Allergies.  Paul Bates  has a past medical history of Chronic kidney disease. Denies past surgical history.  Objective:   Vitals: Pulse 80   Resp 16   Ht 6\' 1"  (1.854 m)   Wt (!) 306 lb 3.2 oz (138.9 kg)   SpO2 99%   BMI 40.40 kg/m   Physical Exam  Constitutional: He is oriented to person, place, and time. He appears well-developed and well-nourished.  Cardiovascular: Normal rate.  Pulmonary/Chest: Effort normal.  Genitourinary: Right testis shows no mass, no swelling and no tenderness. Right testis is descended. Left testis shows no mass, no swelling and no tenderness. Left testis is descended. Uncircumcised. No phimosis, paraphimosis, hypospadias, penile erythema or penile tenderness. No discharge found.  Lymphadenopathy: No inguinal adenopathy noted on the right or left side.  Neurological: He is alert and oriented to person, place, and time.   Results for orders placed or performed in visit on 10/25/17 (from the past 24 hour(s))  POCT urinalysis dipstick     Status: Abnormal   Collection Time: 10/25/17 12:05 PM  Result Value Ref Range   Color, UA yellow yellow   Clarity, UA clear clear   Glucose, UA negative negative mg/dL   Bilirubin, UA negative negative   Ketones, POC UA negative negative mg/dL   Spec Grav, UA 9.5281.020 4.1321.010 - 1.025   Blood, UA small (A) negative   pH, UA 6.5 5.0 - 8.0   Protein Ur, POC negative negative mg/dL   Urobilinogen, UA 0.2 0.2 or 1.0 E.U./dL   Nitrite, UA Negative Negative   Leukocytes, UA Negative Negative  POCT Microscopic Urinalysis ([facility])     Status: Abnormal   Collection Time: 10/25/17 12:06 PM  Result Value Ref Range   WBC,UR,HPF,POC None None WBC/hpf   RBC,UR,HPF,POC None None RBC/hpf   Bacteria Few (A) None, Too numerous to count   Mucus Absent Absent   Epithelial Cells, UR Per Microscopy None None, Too numerous to count cells/hpf   Assessment and Plan :   Testicular pain - Plan: POCT urinalysis dipstick, POCT Microscopic Urinalysis ([facility]), Urine Culture, US Scrotum  Renal stone - Plan: tamsulosin (FLOMAX) 0.4 MG CAPS capsule   Will pursue scrotal U/S. Patient is to start Flomax, emphasized adequate hydration again.   Wallis BambergMario Kila Godina, PA-C [facility] [phone] 10/25/2017 11:35 AM

## 2017-10-25 NOTE — Addendum Note (Signed)
Addended by: Wallis BambergMANI, Shamone Winzer on: 10/25/2017 03:00 PM   Modules accepted: Orders

## 2017-10-26 LAB — URINE CULTURE: Organism ID, Bacteria: NO GROWTH

## 2017-10-28 ENCOUNTER — Ambulatory Visit
Admission: RE | Admit: 2017-10-28 | Discharge: 2017-10-28 | Disposition: A | Discharge status: Home / Self Care | Payer: No Typology Code available for payment source | Source: Ambulatory Visit | Attending: Urgent Care | Admitting: Urgent Care

## 2017-10-28 DIAGNOSIS — N50819 Testicular pain, unspecified: Secondary | ICD-10-CM

## 2017-11-08 ENCOUNTER — Ambulatory Visit: Payer: Self-pay | Admitting: Urgent Care

## 2018-05-06 ENCOUNTER — Ambulatory Visit: Payer: Self-pay | Admitting: Urgent Care

## 2018-05-06 ENCOUNTER — Encounter: Payer: Self-pay | Admitting: Urgent Care

## 2018-05-06 ENCOUNTER — Other Ambulatory Visit: Payer: Self-pay

## 2018-05-06 VITALS — HR 77 | Temp 97.8°F | Resp 18 | Ht 73.0 in | Wt 309.8 lb

## 2018-05-06 DIAGNOSIS — M25562 Pain in left knee: Secondary | ICD-10-CM

## 2018-05-06 DIAGNOSIS — M25462 Effusion, left knee: Secondary | ICD-10-CM

## 2018-05-06 MED ORDER — MELOXICAM 15 MG PO TABS: 15.0000 mg | ORAL_TABLET | Freq: Every day | ORAL | 0 refills | Status: AC

## 2018-05-06 NOTE — Progress Notes (Signed)
   MRN: [id number] DOB: 14-Mar-1986  Subjective:   Paul Bates is a 32 y.o. male presenting for 1 week history of left knee pain after bearing his weight on a hard plastic object. Reports that he feels a burning type sensation, aching of his left knee, elicited with pressure/palpation medially. He walks without issue but cannot bear his weight or touch his knee. Works as a Education administratorpainter, has to kneel frequently. Has tried APAP, ibuprofen. Hydrates well with 6-8 bottles of water daily. Has also had bilateral forearm pain, thigh pain. Denies fever, redness, warmth, swelling. Admits that his pain started after a period of rest of 4 days followed by strenuous work schedule. Denies alcohol use. Patient is requesting medication for weight loss. States that he has tried "everything". He stays active with work but admits that he eats whatever is easiest, eats a lot of fast food. Denies eating much fiber.  Larene PickettFabian is not currently taking any medications.  Also has No Known Allergies.  Larene PickettFabian  has a past medical history of Chronic kidney disease. Denies past surgical history.  Objective:   Vitals: Pulse 77   Temp 97.8 F (36.6 C) (Oral)   Resp 18   Ht 6\' 1"  (1.854 m)   Wt (!) 309 lb 12.8 oz (140.5 kg)   SpO2 98%   BMI 40.87 kg/m   BP Readings from Last 3 Encounters:  11/08/16 98/70  07/13/16 140/80  02/14/15 120/78    Physical Exam  Constitutional: He is oriented to person, place, and time. He appears well-developed and well-nourished.  Cardiovascular: Normal rate.  Pulmonary/Chest: Effort normal.  Musculoskeletal:       Left knee: He exhibits swelling (medially). He exhibits normal range of motion, no effusion, no ecchymosis, no deformity, no laceration, no erythema, normal alignment, normal patellar mobility and no bony tenderness. Tenderness found. Medial joint line tenderness noted. No lateral joint line, no MCL, no LCL and no patellar tendon tenderness noted.  Neurological: He is alert  and oriented to person, place, and time.   Assessment and Plan :   Acute pain of left knee  Swelling of left knee joint  Morbid obesity (HCC)  Will manage conservatively for his knee pain. Patient is to rest the next 2-3 days, use meloxicam. Counseled on dietary modification for weight loss and to help with knee pain. Discussed risks of using medications and will avoid this for now. Follow up if myalgia, knee pain persists.  Wallis BambergMario Karryn Kosinski, PA-C Primary Care at [facility] [phone] 05/06/2018  8:20 AM

## 2018-05-06 NOTE — Patient Instructions (Addendum)
Prevencin de la diabetes mellitus tipo 2 Preventing Type 2 Diabetes Mellitus La diabetes tipo 2 (diabetes mellitus tipo 2) es una enfermedad a largo plazo (crnica) que afecta los niveles de azcar en la sangre (glucosa). Normalmente, una hormona denominada insulina permite el ingreso de la glucosa en las clulas del cuerpo. Las clulas usan la glucosa para obtener energa. En la diabetes tipo 2, puede presentarse uno de los siguientes problemas, o ambos:  El cuerpo no produce la cantidad suficiente de insulina.  El cuerpo no responde de manera adecuada a la insulina que produce (resistencia a la insulina).  La resistencia a la insulina o la falta de insulina hacen que el exceso de glucosa se acumule en la sangre, en lugar de ir a las clulas. Como resultado, se produce un nivel alto de glucemia (hiperglucemia), lo cual puede causar muchas complicaciones. Al tener sobreseo o ser obeso y tener un estilo de vida inactivo (sedentario) puede aumentar su riesgo de padecer diabetes. La diabetes tipo 2 se puede retrasar o prevenir al realizar determinados cambios en la alimentacin y el estilo de vida. Qu cambios se pueden realizar en la alimentacin?  Consuma comidas y refrigerios saludables con regularidad. Tenga a mano un refrigerio saludable cuando sienta hambre entre las comidas, tal como una fruta o un puado de nueces.  Como carnes magras y protenas con bajo contenido de grasas saturadas como pollo, pescado, claras de huevo y frijoles. Evite las carnes procesadas.  Coma muchas frutas y verduras, y muchos granos no procesados (granos enteros). Se recomienda que coma: ? 1 a 2 tazas de fruta todos los das. ? 2 a 3 tazas de verduras todos los das. ? 6 a 8 onzas de granos enteros todos los das, como avena, salvado, trigo bulgur, arroz integral, quinoa y mijo.  Coma productos lcteos de bajo contenido graso, como leche, yogur y queso.  Coma alimentos que contengan grasas saludables, como  frutos secos, aguacate, aceite de oliva y aceite de canola.  Beba agua durante todo el da. Evite los lquidos que contengan azcar agregada como gaseosas o t dulce.  Siga las indicaciones de su mdico respecto de las restricciones especficas para las comidas o las bebidas.  Controle la cantidad de alimentos que come por vez (tamao de la porcin). ? Verifique las etiquetas de los alimentos para verificar los tamaos de las porciones de los alimentos. ? Use una balanza de cocina para pesar las porciones de alimentos.  Saltee o hierva al vapor los alimentos en lugar de frerlos. Cocine con agua o caldo en lugar de aceites o manteca.  Limite su consumo de: ? Sal (sodio). No consuma ms de 1 cucharadita (2,400mg) de sodio por da. Si tiene enfermedad cardaca o presin arterial alta, debe consumir menos de  a  cucharadita (1,500mg) de sodio por da. ? Grasas saturadas. Se trata de grasa slida a temperatura ambiente como la manteca o la grasa de la carne. Qu cambios en el estilo de vida se pueden realizar?  Actividad  Haga actividad fsica de intensidad moderada durante al menos 30minutos como mnimo 5das por semana o con la frecuencia que le indique su mdico.  Pregntele al mdico qu actividades son seguras para usted. Una combinacin de actividades puede ser la mejor opcin, por ejemplo, caminar, practicar natacin, andar en bicicleta y hacer entrenamiento de fuerza.  Trate de agregar actividad fsica a su jornada. Por ejemplo: ? Estacione en lugares ms alejados de lo habitual para tener que caminar ms. Por ejemplo, estacione   en una equina alejada del estacionamiento cuando vaya a la oficina o a la tienda de comestibles. ? Vaya a caminar durante su hora de almuerzo. ? Utilice las escaleras en lugar de ascensores o escaleras mecnicas. Bajar de peso  Baje de peso segn se le indique. El mdico puede determinar cuntos kilos tiene que bajar y ayudarlo a que adelgace de manera  segura.  Si tiene sobrepeso o es obeso, es posible que se le indique que pierda al menos entre el 5 y el 7% de su peso corporal. Alcohol y tabaco   Limite el consumo de alcohol a no ms de 1 medida por da si es mujer y no est embarazada y a 2 medidas por da si es hombre. Una medida equivale a 12onzas de cerveza, 5onzas de vino o 1onzas de bebidas alcohlicas de alta graduacin.  No consuma ningn producto que contenga tabaco, lo que incluye cigarrillos, tabaco de mascar y cigarrillos electrnicos. Si necesita ayuda para dejar de fumar, consulte al mdico. Trabaje con su mdico  Contrlese la glucemia de manera regular, segn lo indicado por su mdico.  Analice sus factores de riesgo y cmo puede reducir su riesgo de padecer diabetes.  Somtase a pruebas de deteccin segn lo indicado por su mdico. Puede realizarse pruebas de deteccin con regularidad, especialmente si tiene ciertos factores de riesgo de padecer diabetes tipo 2.  Haga una cita con un especialista en dietas y nutricin (nutricionista matriculado). Un nutricionista matriculado puede ayudarlo a crear un plan de alimentacin saludable y a comprender los tamaos de las porciones y las etiquetas de los alimentos. Por qu son importantes estos cambios?  Es posible prevenir o retrasar la diabetes tipo 2 y los problemas de salud relacionados al realizar cambios en el estilo de vida y la alimentacin.  Puede ser difcil reconocer los signos de la diabetes tipo 2. La mejor manera de evitar posibles daos en el cuerpo es tomar medidas para prevenir la enfermedad antes que usted desarrolle los sntomas. Qu puede suceder si no se realizan cambios?  Sus niveles de glucemia pueden continuar aumentando. Tener la glucemia alta durante mucho tiempo es peligroso. Demasiada glucosa en la sangre puede daar los vasos sanguneos, el corazn, los riones, los nervios y los ojos.  Puede desarrollar prediabetes o diabetes tipo 2. La  diabetes tipo 2 puede ocasionar muchos problemas de salud crnicos y complicaciones, tales como: ? Enfermedad cardaca. ? Accidente cerebrovascular. ? Ceguera. ? Enfermedad renal. ? Depresin. ? Circulacin deficiente en los pies y las piernas, lo cual podra ocasionar una extirpacin quirrgica (amputacin) en casos graves. Dnde encontrar apoyo:  Pida a su mdico que le recomiende un nutricionista matriculado, un instructor en diabetes o un programa para bajar de peso.  Busque grupos de apoyo para bajar de peso a nivel local o en lnea.  Asista a un gimnasio, club de acondicionamiento fsico o nase a un grupo que realice actividad fsica al aire libre como un club de caminata. Dnde encontrar ms informacin: Para obtener ms informacin sobre la diabetes y la prevencin de la diabetes, visite:  Asociacin Americana de la Diabetes (American Diabetes Association, ADA): www.diabetes.org  Instituto Nacional de la Diabetes y las Enfermedades Digestivas y Renales (National Institute of Diabetes and Digestive and Kidney Diseases): www.niddk.nih.gov/health-information/diabetes  Para obtener ms informacin sobre alimentacin saludable, visite:  Departamento de Agricultura de los [place]., Mi plato (U.S. Department of Agriculture, USDA, Choose My Plate): www.choosemyplate.gov/food-groups  Oficina para la Prevencin de Enfermedades y Promocin de Salud, [place]   de alimentacin (Office of Disease Prevention and Health Promotion, ODPHP, Dietary Guidelines): www.health.gov/dietaryguidelines  Resumen  Puede reducir su riesgo de padecer diabetes tipo 2 al aumentar su actividad fsica, comer alimentos saludables y bajar de peso segn se lo indiquen.  Hable con su mdico sobre su riesgo de padecer diabetes tipo 2. Pregunte sobre los anlisis de sangre o pruebas de deteccin que debe realizarse. Esta informacin no tiene como fin reemplazar el consejo del mdico. Asegrese de hacerle al mdico  cualquier pregunta que tenga. Document Released: 12/06/2015 Document Revised: 02/04/2017 Document Reviewed: 12/06/2015 Elsevier Interactive Patient Education  2018 Elsevier Inc.     IF you received an x-ray today, you will receive an invoice from [facility]. Please contact [facility] at [phone] with questions or concerns regarding your invoice.   IF you received labwork today, you will receive an invoice from [facility]. Please contact [facility] at [phone] with questions or concerns regarding your invoice.   Our billing staff will not be able to assist you with questions regarding bills from these companies.  You will be contacted with the lab results as soon as they are available. The fastest way to get your results is to activate your My Chart account. Instructions are located on the last page of this paperwork. If you have not heard from us regarding the results in 2 weeks, please contact this office.      

## 2018-07-01 ENCOUNTER — Ambulatory Visit: Payer: Self-pay | Admitting: Family Medicine

## 2018-09-15 IMAGING — US US SCROTUM W/ DOPPLER COMPLETE
1 series · 14 of 25 positions shown · non-contrast
Comparison: None.

CLINICAL DATA: Initial evaluation for acute testicular pain for 3
weeks.

EXAM:
SCROTAL ULTRASOUND
DOPPLER ULTRASOUND OF THE TESTICLES
TECHNIQUE: Complete ultrasound examination of the testicles, epididymis, and
other scrotal structures was performed. Color and spectral Doppler
ultrasound were also utilized to evaluate blood flow to the
testicles.

[Series 1: us scrotum w/ doppler complete · 0.05mm/px · 14 of 53 slices shown]
[im 1/53]
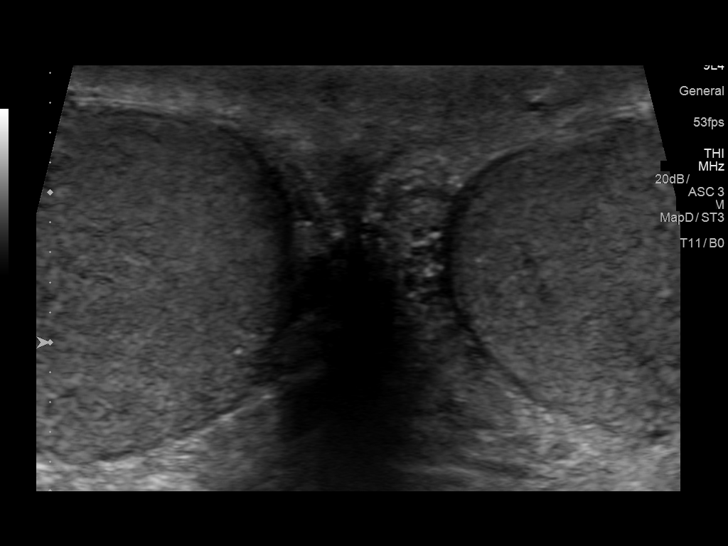
[im 5/53]
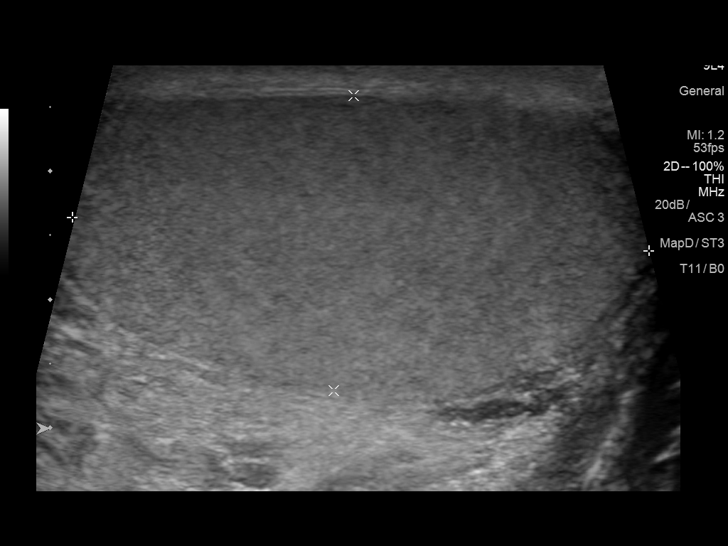
[im 9/53]
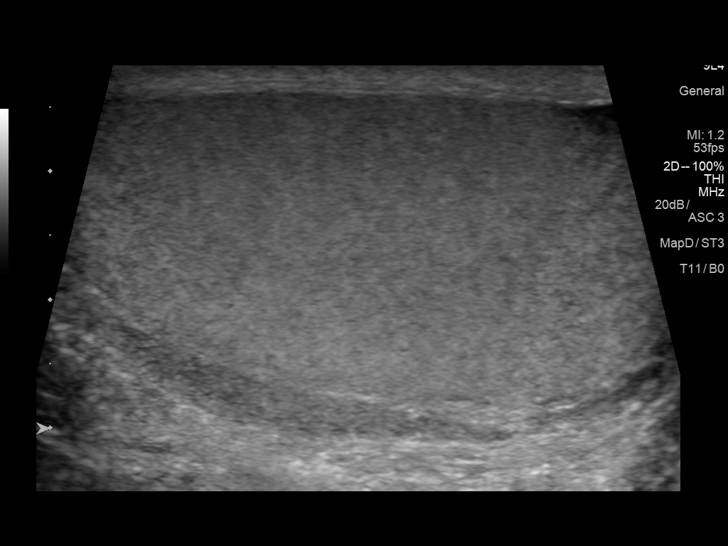
[im 14/53]
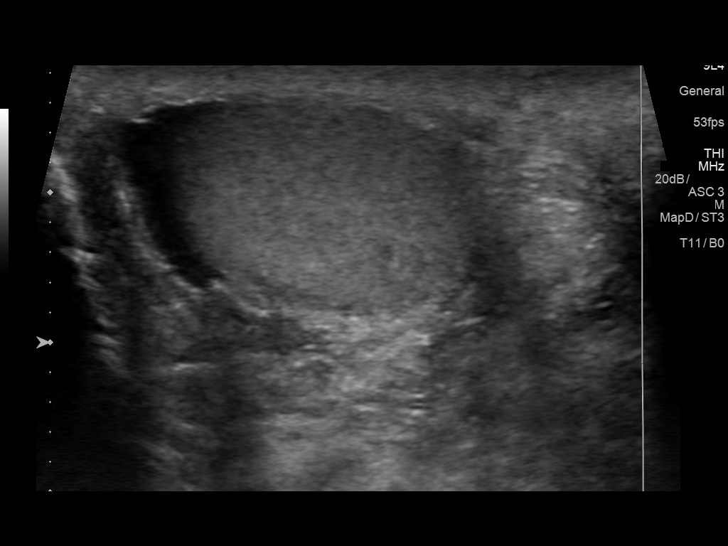
[im 18/53]
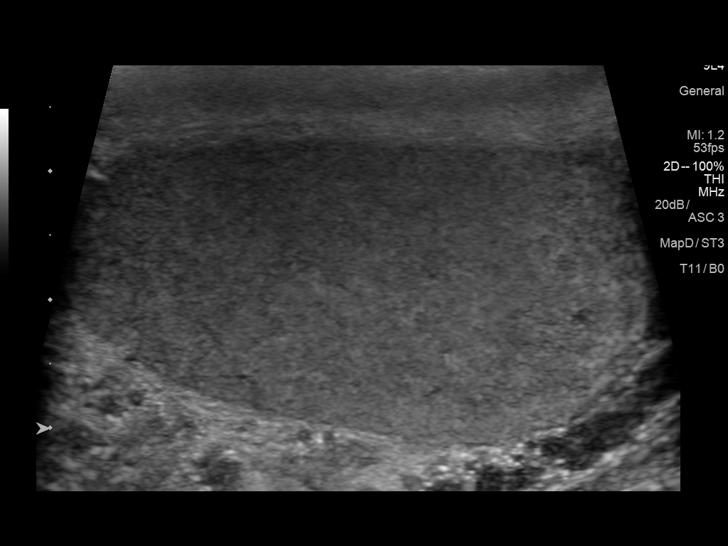
[im 20/53]
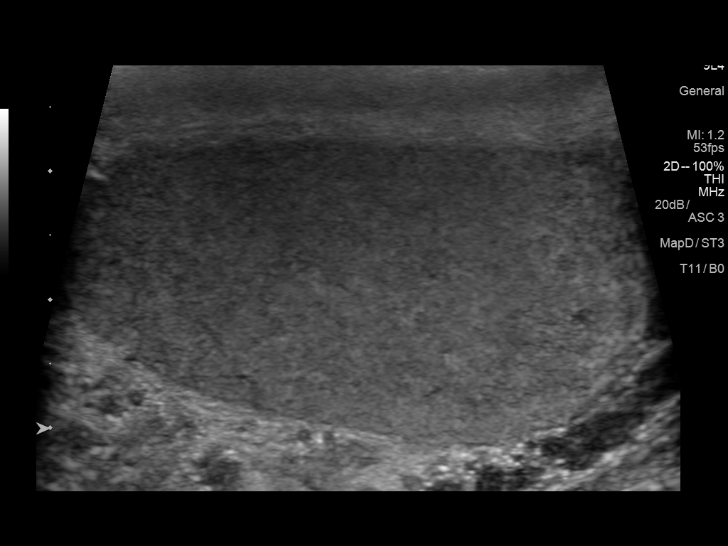
[im 24/53]
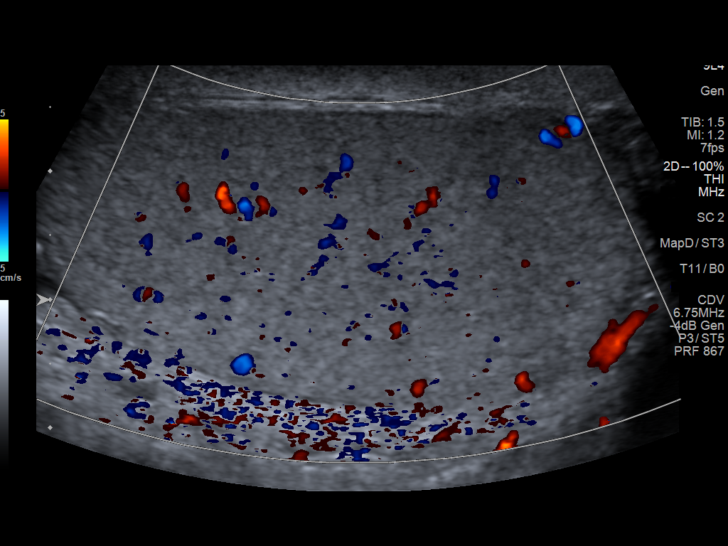
[im 29/53]
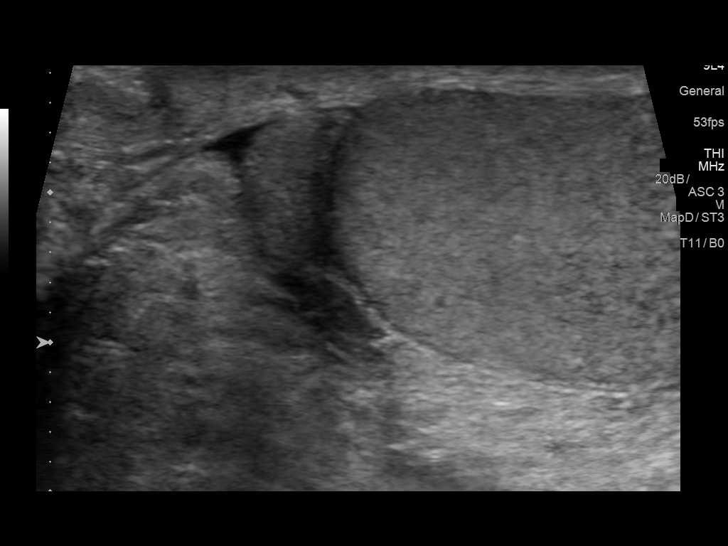
[im 33/53]
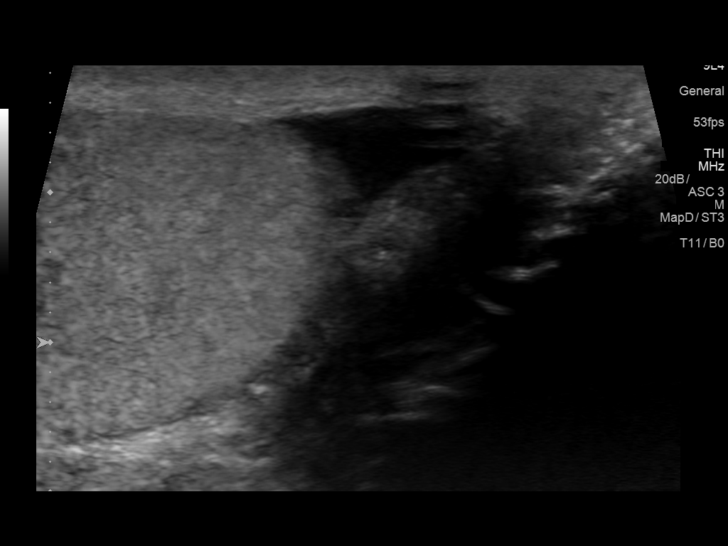
[im 35/53]
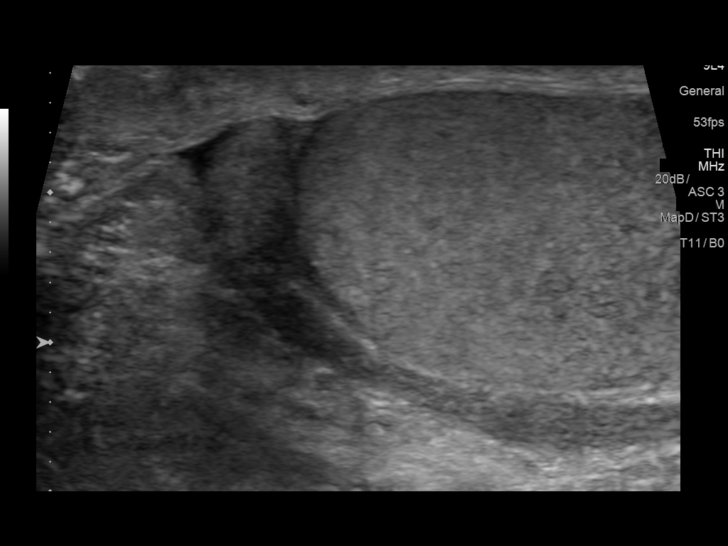
[im 40/53]
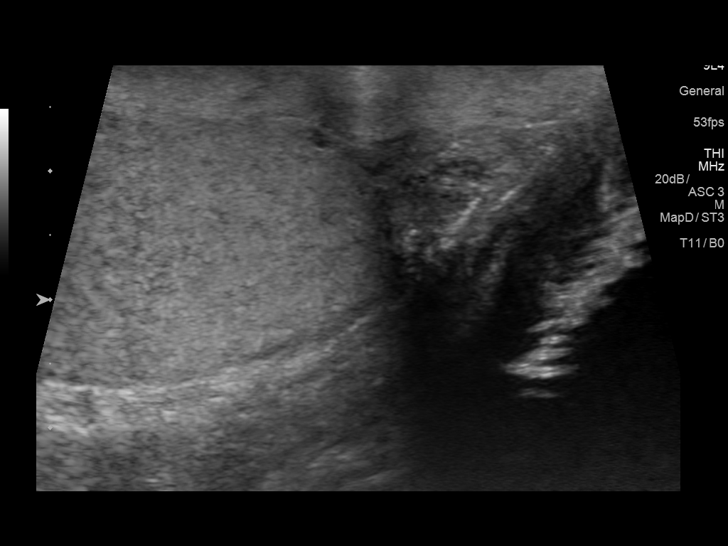
[im 44/53]
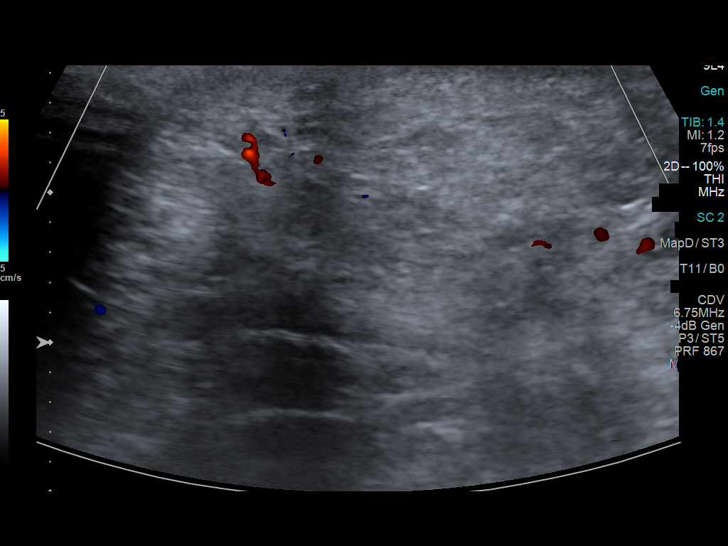
[im 48/53]
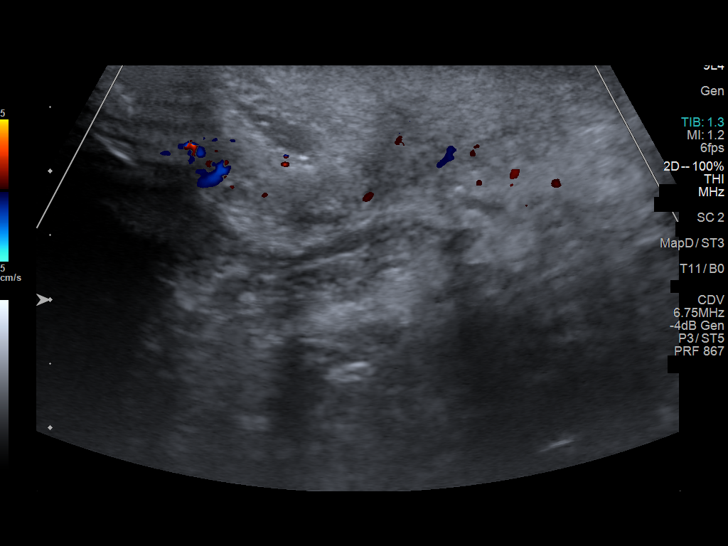
[im 53/53]
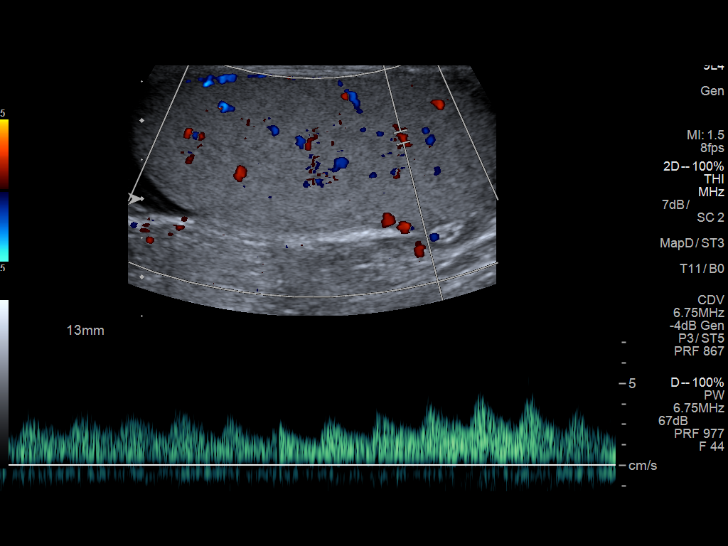

[14 of 25 positions shown; findings below may reference images not displayed]

FINDINGS: Right testicle

Measurements: 4.5 x 2.3 x 2.9 cm. No mass or microlithiasis
visualized.

Left testicle

Measurements: 4.5 x 2.3 x 2.9 cm. No mass or microlithiasis
visualized.

Right epididymis:  Normal in size and appearance.

Left epididymis:  Normal in size and appearance.

Hydrocele:  None visualized.

Varicocele:  None visualized.

Pulsed Doppler interrogation of both testes demonstrates normal low
resistance arterial and venous waveforms bilaterally.
IMPRESSION: Normal testicular ultrasound. No mass lesion or other acute
abnormality identified.

## 2019-10-14 ENCOUNTER — Telehealth (INDEPENDENT_AMBULATORY_CARE_PROVIDER_SITE_OTHER): Payer: Self-pay | Admitting: Emergency Medicine

## 2019-10-14 ENCOUNTER — Encounter: Payer: Self-pay | Admitting: Emergency Medicine

## 2019-10-14 ENCOUNTER — Other Ambulatory Visit: Payer: Self-pay

## 2019-10-14 VITALS — Ht 73.0 in | Wt 260.0 lb

## 2019-10-14 DIAGNOSIS — Z20828 Contact with and (suspected) exposure to other viral communicable diseases: Secondary | ICD-10-CM

## 2019-10-14 DIAGNOSIS — R05 Cough: Secondary | ICD-10-CM

## 2019-10-14 DIAGNOSIS — R059 Cough, unspecified: Secondary | ICD-10-CM

## 2019-10-14 DIAGNOSIS — R6889 Other general symptoms and signs: Secondary | ICD-10-CM

## 2019-10-14 DIAGNOSIS — Z20822 Contact with and (suspected) exposure to covid-19: Secondary | ICD-10-CM

## 2019-10-14 NOTE — Progress Notes (Signed)
Telemedicine Encounter- SOAP NOTE Established Patient  This telephone encounter was conducted with the patient's (or proxy's) verbal consent via audio telecommunications: yes/no: Yes Patient was instructed to have this encounter in a suitably private space; and to only have persons present to whom they give permission to participate. In addition, patient identity was confirmed by use of name plus two identifiers (DOB and address).  I discussed the limitations, risks, security and privacy concerns of performing an evaluation and management service by telephone and the availability of in person appointments. I also discussed with the patient that there may be a patient responsible charge related to this service. The patient expressed understanding and agreed to proceed.  I spent a total of TIME; 0 MIN TO 60 MIN: 15 minutes talking with the patient or their proxy.  Chief Complaint  Patient presents with  . Cough    mucus white and grey started Wednesday, Tuesday last week had a little fever  . Sore Throat    started this past weekend    Subjective   Paul Bates is a 33 y.o. male established patient. Telephone visit today complaining of flulike symptoms that started 1 week ago progressively getting worse.  Mostly complaining of a dry cough with intermittent headaches and generalized achiness.  Occasional sore throat but able to eat and drink okay denies nausea or vomiting.  Denies high fever or chills.  Denies chest pain.  Some shortness of breath when he coughs only.  No other significant symptoms.  HPI   There are no problems to display for this patient.   Past Medical History:  Diagnosis Date  . Chronic kidney disease     Current Outpatient Medications  Medication Sig Dispense Refill  . meloxicam (MOBIC) 15 MG tablet Take 1 tablet (15 mg total) by mouth daily. (Patient not taking: Reported on 10/14/2019) 30 tablet 0   No current facility-administered medications for  this visit.    No Known Allergies  Social History   Socioeconomic History  . Marital status: Married    Spouse name: Not on file  . Number of children: Not on file  . Years of education: Not on file  . Highest education level: Not on file  Occupational History  . Not on file  Tobacco Use  . Smoking status: Never Smoker  . Smokeless tobacco: Never Used  Substance and Sexual Activity  . Alcohol use: No    Alcohol/week: 0.0 standard drinks  . Drug use: No  . Sexual activity: Not on file  Other Topics Concern  . Not on file  Social History Narrative  . Not on file   Social Determinants of Health   Financial Resource Strain:   . Difficulty of Paying Living Expenses: Not on file  Food Insecurity:   . Worried About Programme researcher, broadcasting/film/video in the Last Year: Not on file  . Ran Out of Food in the Last Year: Not on file  Transportation Needs:   . Lack of Transportation (Medical): Not on file  . Lack of Transportation (Non-Medical): Not on file  Physical Activity:   . Days of Exercise per Week: Not on file  . Minutes of Exercise per Session: Not on file  Stress:   . Feeling of Stress : Not on file  Social Connections:   . Frequency of Communication with Friends and Family: Not on file  . Frequency of Social Gatherings with Friends and Family: Not on file  . Attends Religious Services: Not  on file  . Active Member of Clubs or Organizations: Not on file  . Attends Archivist Meetings: Not on file  . Marital Status: Not on file  Intimate Partner Violence:   . Fear of Current or Ex-Partner: Not on file  . Emotionally Abused: Not on file  . Physically Abused: Not on file  . Sexually Abused: Not on file    Review of Systems  Constitutional: Negative.  Negative for chills and fever.  HENT: Positive for congestion and sore throat.   Respiratory: Positive for cough. Negative for shortness of breath and wheezing.   Cardiovascular: Negative for chest pain and palpitations.   Gastrointestinal: Negative.  Negative for abdominal pain, diarrhea, nausea and vomiting.  Genitourinary: Negative.  Negative for dysuria and hematuria.  Musculoskeletal: Negative.  Negative for myalgias.  Skin: Negative.  Negative for rash.  Neurological: Negative.  Negative for dizziness and headaches.  Endo/Heme/Allergies: Negative.   All other systems reviewed and are negative.   Objective  Alert and oriented x3 in no apparent respiratory distress. Vitals as reported by the patient: Today's Vitals   10/14/19 1615  Weight: 260 lb (117.9 kg)  Height: 6\' 1"  (1.854 m)    There are no diagnoses linked to this encounter.  Jaymie was seen today for cough and sore throat.  Diagnoses and all orders for this visit:  Flu-like symptoms  Cough  Suspected COVID-19 virus infection    Covid infection most likely.  Advised to get tested for Covid by calling [phone].  Covid precautions and ED precautions given. No red flag signs or symptoms.  Clinically stable.  No complications.  I discussed the assessment and treatment plan with the patient. The patient was provided an opportunity to ask questions and all were answered. The patient agreed with the plan and demonstrated an understanding of the instructions.   The patient was advised to call back or seek an in-person evaluation if the symptoms worsen or if the condition fails to improve as anticipated.  I provided 15 minutes of non-face-to-face time during this encounter.  Horald Pollen, MD  [facility]

## 2019-10-15 ENCOUNTER — Telehealth: Payer: Self-pay | Admitting: Family Medicine

## 2022-09-18 ENCOUNTER — Ambulatory Visit: Payer: Self-pay | Admitting: Emergency Medicine
